# Patient Record
Sex: Male | Born: 1995 | Race: Black or African American | Hispanic: No | Marital: Single | Smoking: Never smoker
Health system: Southern US, Community
[De-identification: ages and names within clinical notes are randomized; demographics above are authoritative.]

---
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
## 2016-09-04 ENCOUNTER — Encounter (HOSPITAL_COMMUNITY): Payer: Self-pay

## 2016-09-04 ENCOUNTER — Emergency Department (HOSPITAL_COMMUNITY)
Admission: EM | Admit: 2016-09-04 | Discharge: 2016-09-05 | Disposition: A | Discharge status: Home / Self Care | Payer: Medicaid Other | Attending: Emergency Medicine | Admitting: Emergency Medicine

## 2016-09-04 DIAGNOSIS — N342 Other urethritis: Secondary | ICD-10-CM | POA: Insufficient documentation

## 2016-09-04 LAB — URINALYSIS, ROUTINE W REFLEX MICROSCOPIC
Bilirubin Urine: NEGATIVE
Glucose, UA: NEGATIVE mg/dL
Hgb urine dipstick: NEGATIVE
Ketones, ur: NEGATIVE mg/dL
Leukocytes, UA: NEGATIVE
Nitrite: NEGATIVE
Protein, ur: NEGATIVE mg/dL
Specific Gravity, Urine: 1.01 (ref 1.005–1.030)
pH: 6.5 (ref 5.0–8.0)

## 2016-09-04 MED ORDER — AZITHROMYCIN 250 MG PO TABS
1000.0000 mg | ORAL_TABLET | Freq: Once | ORAL | Status: AC
  Administered 2016-09-04: 1000 mg via ORAL
  Filled 2016-09-04: qty 4

## 2016-09-04 MED ORDER — LIDOCAINE HCL (PF) 1 % IJ SOLN
INTRAMUSCULAR | Status: AC
  Administered 2016-09-04
  Filled 2016-09-04: qty 5

## 2016-09-04 MED ORDER — CEFTRIAXONE SODIUM 250 MG IJ SOLR
250.0000 mg | Freq: Once | INTRAMUSCULAR | Status: AC
  Administered 2016-09-04: 250 mg via INTRAMUSCULAR
  Filled 2016-09-04: qty 250

## 2016-09-04 NOTE — ED Provider Notes (Signed)
[facility]-EMERGENCY DEPT Provider Note   CSN: [id number] Arrival date & time: 09/04/16  2119     History   Chief Complaint Chief Complaint  Patient presents with  . Urinary Tract Infection    HPI Joel Elliott is a 20 y.o. male.  The history is provided by the patient. No language interpreter was used.  Urinary Tract Infection   This is a new problem. The problem occurs every urination. The problem has not changed since onset.The pain is moderate. There has been no fever. Pertinent negatives include no chills and no vomiting. He has tried nothing for the symptoms.  Pt complains of burning with urination  History reviewed. No pertinent past medical history.  There are no active problems to display for this patient.   History reviewed. No pertinent surgical history.     Home Medications    Prior to Admission medications   Medication Sig Start Date End Date Taking? Authorizing Provider  albuterol (PROVENTIL HFA;VENTOLIN HFA) 108 (90 BASE) MCG/ACT inhaler Inhale 2 puffs into the lungs every 6 (six) hours as needed. For shortness of breath    Historical Provider, MD    Family History History reviewed. No pertinent family history.  Social History Social History  Substance Use Topics  . Smoking status: Never Smoker  . Smokeless tobacco: Never Used  . Alcohol use No     Allergies   Review of patient's allergies indicates no known allergies.   Review of Systems Review of Systems  Constitutional: Negative for chills.  Gastrointestinal: Negative for vomiting.  All other systems reviewed and are negative.    Physical Exam Updated Vital Signs BP 130/66 (BP Location: Left Arm)   Pulse 83   Temp 98.2 F (36.8 C) (Oral)   Resp 16   Ht 5\' 7"  (1.702 m)   Wt 54.9 kg   SpO2 100%   BMI 18.95 kg/m   Physical Exam  Constitutional: He is oriented to person, place, and time. He appears well-developed and well-nourished.  HENT:  Head: Normocephalic and atraumatic.    Cardiovascular: Normal rate.   Pulmonary/Chest: Effort normal.  Abdominal: Soft.  Genitourinary: Penis normal. No penile tenderness.  Musculoskeletal: Normal range of motion.  Neurological: He is alert and oriented to person, place, and time.  Skin: Skin is warm.  Psychiatric: He has a normal mood and affect.  Nursing note and vitals reviewed.    ED Treatments / Results  Labs (all labs ordered are listed, but only abnormal results are displayed) Labs Reviewed  URINALYSIS, ROUTINE W REFLEX MICROSCOPIC (NOT AT [facility]) - Abnormal; Notable for the following:       Result Value   Color, Urine STRAW (*)    All other components within normal limits  GC/CHLAMYDIA PROBE AMP ([place]) NOT AT [facility]    EKG  EKG Interpretation None       Radiology No results found.  Procedures Procedures (including critical care time)  Medications Ordered in ED Medications  cefTRIAXone (ROCEPHIN) injection 250 mg (not administered)  azithromycin (ZITHROMAX) tablet 1,000 mg (not administered)     Initial Impression / Assessment and Plan / ED Course  I have reviewed the triage vital signs and the nursing notes.  Pertinent labs & imaging results that were available during my care of the patient were reviewed by me and considered in my medical decision making (see chart for details).  Clinical Course    gc and ct pending   Pt given Rocephin and Zithromax Pt counseled  on std's and condom use Final Clinical Impressions(s) / ED Diagnoses   Final diagnoses:  Urethritis    New Prescriptions New Prescriptions   No medications on file  An After Visit Summary was printed and given to the patient.   Lonia Skinner [place], PA-C 09/04/16 2342    Layla Maw [place], DO 09/05/16 6045

## 2016-09-04 NOTE — ED Triage Notes (Signed)
I am having burning with urination that started around the beginning of the week.  Last sexual activity was on Saturday and started burning on Monday.  Did use condoms on Saturday.  Was with my normal sexual partner.

## 2016-09-08 LAB — GC/CHLAMYDIA PROBE AMP (~~LOC~~) NOT AT ARMC
CHLAMYDIA, DNA PROBE: NEGATIVE
NEISSERIA GONORRHEA: NEGATIVE

## 2018-06-08 ENCOUNTER — Emergency Department (HOSPITAL_COMMUNITY)
Admission: EM | Admit: 2018-06-08 | Discharge: 2018-06-08 | Disposition: A | Discharge status: Home / Self Care | Payer: Self-pay | Attending: Emergency Medicine | Admitting: Emergency Medicine

## 2018-06-08 ENCOUNTER — Encounter (HOSPITAL_COMMUNITY): Payer: Self-pay | Admitting: Emergency Medicine

## 2018-06-08 ENCOUNTER — Other Ambulatory Visit: Payer: Self-pay

## 2018-06-08 DIAGNOSIS — N342 Other urethritis: Secondary | ICD-10-CM

## 2018-06-08 DIAGNOSIS — N341 Nonspecific urethritis: Secondary | ICD-10-CM | POA: Insufficient documentation

## 2018-06-08 LAB — URINALYSIS, ROUTINE W REFLEX MICROSCOPIC
BACTERIA UA: NONE SEEN
BILIRUBIN URINE: NEGATIVE
Glucose, UA: NEGATIVE mg/dL
HGB URINE DIPSTICK: NEGATIVE
Ketones, ur: NEGATIVE mg/dL
NITRITE: NEGATIVE
PROTEIN: NEGATIVE mg/dL
Specific Gravity, Urine: 1.017 (ref 1.005–1.030)
pH: 8 (ref 5.0–8.0)

## 2018-06-08 MED ORDER — AZITHROMYCIN 250 MG PO TABS
1000.0000 mg | ORAL_TABLET | Freq: Once | ORAL | Status: AC
  Administered 2018-06-08: 1000 mg via ORAL
  Filled 2018-06-08: qty 4

## 2018-06-08 MED ORDER — CEFTRIAXONE SODIUM 250 MG IJ SOLR
250.0000 mg | Freq: Once | INTRAMUSCULAR | Status: AC
  Administered 2018-06-08: 250 mg via INTRAMUSCULAR
  Filled 2018-06-08: qty 250

## 2018-06-08 MED ORDER — ONDANSETRON 4 MG PO TBDP
ORAL_TABLET | ORAL | Status: AC
  Filled 2018-06-08: qty 1

## 2018-06-08 MED ORDER — LIDOCAINE HCL (PF) 1 % IJ SOLN
INTRAMUSCULAR | Status: AC
  Administered 2018-06-08: 0.9 mL
  Filled 2018-06-08: qty 2

## 2018-06-08 MED ORDER — ONDANSETRON 4 MG PO TBDP
4.0000 mg | ORAL_TABLET | Freq: Once | ORAL | Status: AC
  Administered 2018-06-08: 4 mg via ORAL

## 2018-06-08 NOTE — ED Triage Notes (Signed)
Pt reports burning to urethra and yellow/clear penile discharge. Recent unprotected intercourse.

## 2018-06-08 NOTE — Discharge Instructions (Addendum)
You have been treated for gonorrhea and chlamydia today with the 2 medicines given you as these are the 2 most likely infections that cause discharge and pain.  Your cultures should take about 2 days to result and you will be notified if positive.  Your girlfriend will need to be treated, too.  Do not have sex for the next week or until you have been treated and your symptoms are gone.  Your HIV and your syphilis blood tests are also pending and you will be notified if either is positive as well.

## 2018-06-08 NOTE — ED Provider Notes (Signed)
[facility] EMERGENCY DEPARTMENT Provider Note   CSN: [id number] Arrival date & time: 06/08/18  1723     History   Chief Complaint Chief Complaint  Patient presents with  . SEXUALLY TRANSMITTED DISEASE    HPI Joel Elliott is a 22 y.o. male reporting unprotected sex with his girlfriend of the past few months with burning pain with urination, increased urinary frequency and sensation of incomplete emptying of his bladder in association with clear to yellow penile discharge.  He denies fevers, chills, sore throat, abdominal, pelvic or back pain and also denies rash or penile lesions.  His girlfriend is asymptomatic.  Pt has had no treatment prior to arrival.  The history is provided by the patient.    History reviewed. No pertinent past medical history.  There are no active problems to display for this patient.   History reviewed. No pertinent surgical history.      Home Medications    Prior to Admission medications   Medication Sig Start Date End Date Taking? Authorizing Provider  albuterol (PROVENTIL HFA;VENTOLIN HFA) 108 (90 BASE) MCG/ACT inhaler Inhale 2 puffs into the lungs every 6 (six) hours as needed. For shortness of breath    [provider]    Family History No family history on file.  Social History Social History   Tobacco Use  . Smoking status: Never Smoker  . Smokeless tobacco: Never Used  Substance Use Topics  . Alcohol use: No  . Drug use: Yes    Types: Marijuana    Comment: last use this morning     Allergies   Patient has no known allergies.   Review of Systems Review of Systems  Constitutional: Negative for chills and fever.  HENT: Negative for congestion and sore throat.   Eyes: Negative.   Respiratory: Negative for chest tightness and shortness of breath.   Cardiovascular: Negative for chest pain.  Gastrointestinal: Negative for abdominal pain, nausea and vomiting.  Genitourinary: Positive for discharge, dysuria and  frequency. Negative for scrotal swelling, testicular pain and urgency.  Musculoskeletal: Negative for arthralgias, joint swelling and neck pain.  Skin: Negative.  Negative for rash and wound.  Neurological: Negative for dizziness, weakness, light-headedness, numbness and headaches.  Psychiatric/Behavioral: Negative.      Physical Exam Updated Vital Signs BP 136/80   Pulse 79   Temp 99.3 F (37.4 C) (Oral)   Resp 18   Ht 5\' 7"  (1.702 m)   Wt 54.4 kg (120 lb)   SpO2 100%   BMI 18.79 kg/m   Physical Exam  Constitutional: He appears well-developed and well-nourished.  HENT:  Head: Normocephalic and atraumatic.  Eyes: Conjunctivae are normal.  Neck: Normal range of motion.  Cardiovascular: Normal rate, regular rhythm and normal heart sounds.  Pulmonary/Chest: Effort normal and breath sounds normal. He has no wheezes.  Abdominal: Soft. Bowel sounds are normal. He exhibits no distension. There is no tenderness. There is no guarding.  Genitourinary: Penis normal.  Genitourinary Comments: Chaperone was present during exam.   Musculoskeletal: Normal range of motion.  Neurological: He is alert.  Skin: Skin is warm and dry.  Psychiatric: He has a normal mood and affect.  Nursing note and vitals reviewed.    ED Treatments / Results  Labs (all labs ordered are listed, but only abnormal results are displayed) Labs Reviewed  URINALYSIS, ROUTINE W REFLEX MICROSCOPIC - Abnormal; Notable for the following components:      Result Value   Leukocytes, UA SMALL (*)  WBC, UA >50 (*)    All other components within normal limits  HIV ANTIBODY (ROUTINE TESTING)  RPR  GC/CHLAMYDIA PROBE AMP ([place]) NOT AT [facility]    EKG None  Radiology No results found.  Procedures Procedures (including critical care time)  Medications Ordered in ED Medications  cefTRIAXone (ROCEPHIN) injection 250 mg (has no administration in time range)  azithromycin (ZITHROMAX) tablet 1,000 mg (has no  administration in time range)     Initial Impression / Assessment and Plan / ED Course  I have reviewed the triage vital signs and the nursing notes.  Pertinent labs & imaging results that were available during my care of the patient were reviewed by me and considered in my medical decision making (see chart for details).     Urinalysis reviewed. Gc/chlamydia cx pending, pt also requested HIV, syphilis screening.  Given rocephin, zithromax. Pt aware cultures pending.  Discussed safe sex, condoms.  Advised girlfriend will need to seek tx also if cx positive. No abd or back pain, no fevers. Exam and hx suggesting simple urethritis.  Final Clinical Impressions(s) / ED Diagnoses   Final diagnoses:  Urethritis    ED Discharge Orders    None       Victoriano Laindol, Kaleo Condrey, PA-C 06/08/18 1826    Maia PlanLong, Joshua G, MD 06/08/18 1919

## 2018-06-09 LAB — RPR: RPR Ser Ql: NONREACTIVE

## 2018-06-09 LAB — HIV ANTIBODY (ROUTINE TESTING W REFLEX): HIV Screen 4th Generation wRfx: NONREACTIVE

## 2018-06-09 LAB — GC/CHLAMYDIA PROBE AMP (~~LOC~~) NOT AT ARMC
Chlamydia: POSITIVE — AB
NEISSERIA GONORRHEA: POSITIVE — AB

## 2019-11-09 ENCOUNTER — Other Ambulatory Visit: Payer: Self-pay

## 2019-11-09 ENCOUNTER — Emergency Department
Admission: EM | Admit: 2019-11-09 | Discharge: 2019-11-09 | Disposition: A | Discharge status: Home / Self Care | Payer: Medicaid Other | Attending: Student in an Organized Health Care Education/Training Program | Admitting: Student in an Organized Health Care Education/Training Program

## 2019-11-09 DIAGNOSIS — M7918 Myalgia, other site: Secondary | ICD-10-CM

## 2019-11-09 DIAGNOSIS — M545 Low back pain: Secondary | ICD-10-CM | POA: Insufficient documentation
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
## 2021-04-21 ENCOUNTER — Other Ambulatory Visit: Payer: Self-pay

## 2021-04-21 ENCOUNTER — Emergency Department
Admission: EM | Admit: 2021-04-21 | Discharge: 2021-04-21 | Disposition: A | Discharge status: Home / Self Care | Payer: 59 | Attending: Emergency Medicine | Admitting: Emergency Medicine

## 2021-04-21 ENCOUNTER — Emergency Department: Payer: 59

## 2021-04-21 DIAGNOSIS — X58XXXA Exposure to other specified factors, initial encounter: Secondary | ICD-10-CM | POA: Diagnosis not present

## 2021-04-21 DIAGNOSIS — J069 Acute upper respiratory infection, unspecified: Secondary | ICD-10-CM | POA: Diagnosis not present

## 2021-04-21 DIAGNOSIS — S3992XA Unspecified injury of lower back, initial encounter: Secondary | ICD-10-CM | POA: Diagnosis present

## 2021-04-21 DIAGNOSIS — S39012A Strain of muscle, fascia and tendon of lower back, initial encounter: Secondary | ICD-10-CM | POA: Diagnosis not present

## 2021-04-21 DIAGNOSIS — J40 Bronchitis, not specified as acute or chronic: Secondary | ICD-10-CM | POA: Diagnosis not present

## 2021-04-21 LAB — URINALYSIS, COMPLETE (UACMP) WITH MICROSCOPIC
Bacteria, UA: NONE SEEN
Bilirubin Urine: NEGATIVE
Glucose, UA: NEGATIVE mg/dL
Ketones, ur: NEGATIVE mg/dL
Leukocytes,Ua: NEGATIVE
Nitrite: NEGATIVE
Protein, ur: NEGATIVE mg/dL
Specific Gravity, Urine: 1.006 (ref 1.005–1.030)
Squamous Epithelial / LPF: NONE SEEN (ref 0–5)
pH: 9 — ABNORMAL HIGH (ref 5.0–8.0)

## 2021-04-21 MED ORDER — AZITHROMYCIN 250 MG PO TABS: 250.0000 mg | ORAL_TABLET | Freq: Every day | ORAL | 0 refills | Status: AC

## 2021-04-21 MED ORDER — AZITHROMYCIN 500 MG PO TABS
500.0000 mg | ORAL_TABLET | Freq: Once | ORAL | Status: AC
  Administered 2021-04-21: 500 mg via ORAL
  Filled 2021-04-21: qty 1

## 2021-04-21 NOTE — ED Notes (Signed)
Provided DC instructions. Verbalized understanding.  

## 2021-04-21 NOTE — ED Provider Notes (Signed)
[facility] Emergency Department Provider Note ____________________________________________  Time seen: 1509  I have reviewed the triage vital signs and the nursing notes.  HISTORY  Chief Complaint  Back Pain and Nasal Congestion  HPI Joel Elliott is a 25 y.o. male presents to the ED for evaluation of productive cough.  He reports onset today, but denies any associated fever, nausea, vomiting.  He does report some subjective fevers and chills.  He denies any nausea, vomiting, diarrhea, or constipation.  He also reports some bilateral lumbosacral back pain, that he believes is related to his work activities.  He denies any bladder or bowel incontinence, foot drop, or saddle anesthesias.  History reviewed. No pertinent past medical history.  There are no problems to display for this patient.   History reviewed. No pertinent surgical history.  Prior to Admission medications   Medication Sig Start Date End Date Taking? Authorizing Provider  azithromycin (ZITHROMAX Z-PAK) 250 MG tablet Take 1 tablet (250 mg total) by mouth daily for 4 days. 04/22/21 04/26/21 Yes Lorece Keach, Charlesetta Ivory, PA-C  albuterol (PROVENTIL HFA;VENTOLIN HFA) 108 (90 BASE) MCG/ACT inhaler Inhale 2 puffs into the lungs every 6 (six) hours as needed. For shortness of breath    [provider]    Allergies Patient has no known allergies.  History reviewed. No pertinent family history.  Social History Social History   Tobacco Use  . Smoking status: Never Smoker  . Smokeless tobacco: Never Used  Vaping Use  . Vaping Use: Never used  Substance Use Topics  . Alcohol use: No  . Drug use: Yes    Types: Marijuana    Comment: last use this morning    Review of Systems  Constitutional: Negative for fever. Eyes: Negative for visual changes. ENT: Negative for sore throat. Cardiovascular: Negative for chest pain. Respiratory: Negative for shortness of breath.  Reports productive  cough. Gastrointestinal: Negative for abdominal pain, vomiting and diarrhea. Genitourinary: Negative for dysuria. Musculoskeletal: Positive for lower back pain. Skin: Negative for rash. Neurological: Negative for headaches, focal weakness or numbness. ____________________________________________  PHYSICAL EXAM:  VITAL SIGNS: ED Triage Vitals  Enc Vitals Group     BP 04/21/21 1503 (!) 141/74     Pulse Rate 04/21/21 1503 68     Resp 04/21/21 1503 18     Temp 04/21/21 1507 99.8 F (37.7 C)     Temp Source 04/21/21 1507 Oral     SpO2 04/21/21 1503 100 %     Weight 04/21/21 1504 130 lb (59 kg)     Height 04/21/21 1504 5\' 7"  (1.702 m)     Head Circumference --      Peak Flow --      Pain Score 04/21/21 1503 6     Pain Loc --      Pain Edu? --      Excl. in GC? --     Constitutional: Alert and oriented. Well appearing and in no distress. Head: Normocephalic and atraumatic. Eyes: Conjunctivae are normal. PERRL. Normal extraocular movements Ears: Canals clear. TMs intact bilaterally. Nose: No congestion/rhinorrhea/epistaxis. Mouth/Throat: Mucous membranes are moist. Neck: Supple. No thyromegaly. Hematological/Lymphatic/Immunological: No cervical lymphadenopathy. Cardiovascular: Normal rate, regular rhythm. Normal distal pulses. Respiratory: Normal respiratory effort. No wheezes/rales/rhonchi. Gastrointestinal: Soft and nontender. No distention. Musculoskeletal: Spinal alignment without midline tenderness, spasm, vomiting, or step-off.  Patient with reproducible bilateral musculoskeletal pain along the paraspinal musculature.  Nontender with normal range of motion in all extremities.  Neurologic: Cranial nerves  II to XII grossly intact.  Normal gait without ataxia. Normal speech and language. No gross focal neurologic deficits are appreciated. Skin:  Skin is warm, dry and intact. No rash noted. ____________________________________________   RADIOLOGY  CXR IMPRESSION: No active  cardiopulmonary disease.  ____________________________________________  PROCEDURES  Azithromycin 500 mg p.o.  Procedures ____________________________________________   INITIAL IMPRESSION / ASSESSMENT AND PLAN / ED COURSE  As part of my medical decision making, I reviewed the following data within the electronic MEDICAL RECORD NUMBER Radiograph reviewed WNL and Notes from prior ED visits     Patient ED evaluation of 1 day complaint of productive cough as well as some bilateral lower back musculoskeletal pain.  Exam is overall benign return at this time for no signs of acute respiratory distress, or toxic appearance.  Patient with reproducible musculoskeletal pain on exam consistent with muscle strain.  X-ray did not reveal any acute intrathoracic process.  Patient will be treated empirically with azithromycin for presumed bronchitis versus subclinical pneumonia.  He will follow-up with primary provider return to the ED if needed.  [name] was evaluated in Emergency Department on 04/21/2021 for the symptoms described in the history of present illness. He was evaluated in the context of the global COVID-19 pandemic, which necessitated consideration that the patient might be at risk for infection with the SARS-CoV-2 virus that causes COVID-19. Institutional protocols and algorithms that pertain to the evaluation of patients at risk for COVID-19 are in a state of rapid change based on information released by regulatory bodies including the CDC and federal and state organizations. These policies and algorithms were followed during the patient's care in the ED. ____________________________________________  FINAL CLINICAL IMPRESSION(S) / ED DIAGNOSES  Final diagnoses:  Viral URI with cough  Bronchitis  Lumbar strain, initial encounter      Lissa Hoard, PA-C 04/21/21 1926    Merwyn Katos, MD 04/21/21 2322

## 2021-04-21 NOTE — Discharge Instructions (Signed)
Your exam, labs, chest x-ray are all normal and reassuring at this time.  No indication of any acute infectious process.  You were treated for bronchitis at this time with a prescription for azithromycin antibiotic.  Take as directed.  Continue to hydrate to help clear secretions.  Consider over-the-counter cough medicine as needed.  You may also take ibuprofen as needed for musculoskeletal back pain.  Follow-up with your provider or [place] urgent care for ongoing symptoms.

## 2021-04-21 NOTE — ED Triage Notes (Signed)
Pt states he has had back pain and nose stuffiness for the past few days- pt states he has also been coughing up some phelgm

## 2021-08-04 ENCOUNTER — Emergency Department: Payer: Medicaid Other

## 2021-08-04 ENCOUNTER — Emergency Department
Admission: EM | Admit: 2021-08-04 | Discharge: 2021-08-04 | Disposition: A | Discharge status: Home / Self Care | Payer: Medicaid Other | Attending: Emergency Medicine | Admitting: Emergency Medicine

## 2021-08-04 ENCOUNTER — Encounter: Payer: Self-pay | Admitting: Emergency Medicine

## 2021-08-04 ENCOUNTER — Other Ambulatory Visit: Payer: Self-pay

## 2021-08-04 DIAGNOSIS — Y9351 Activity, roller skating (inline) and skateboarding: Secondary | ICD-10-CM | POA: Insufficient documentation

## 2021-08-04 DIAGNOSIS — Y9289 Other specified places as the place of occurrence of the external cause: Secondary | ICD-10-CM | POA: Insufficient documentation

## 2021-08-04 DIAGNOSIS — S63501A Unspecified sprain of right wrist, initial encounter: Secondary | ICD-10-CM

## 2021-08-04 MED ORDER — MELOXICAM 15 MG PO TABS: 15.0000 mg | ORAL_TABLET | Freq: Every day | ORAL | 0 refills | Status: AC

## 2021-08-04 NOTE — ED Triage Notes (Signed)
Pt fell on R wrist while skating today, c/o pain and decreased ROM.

## 2021-08-04 NOTE — ED Provider Notes (Signed)
[facility] Emergency Department Provider Note  ____________________________________________  Time seen: Approximately 8:51 PM  I have reviewed the triage vital signs and the nursing notes.   HISTORY  Chief Complaint Wrist Injury    HPI Joel Elliott is a 25 y.o. male who presents the emergency department complaining of right wrist pain.  Patient states that he was skateboarding had multiple falls onto his wrist.  He is unsure which phone because the pain when he got home he realized that his wrist was hurting along the dorsal aspect.  He still able to move his wrist appropriately.  No other injury or complaint.  No medications prior to arrival.       History reviewed. No pertinent past medical history.  There are no problems to display for this patient.   History reviewed. No pertinent surgical history.  Prior to Admission medications   Medication Sig Start Date End Date Taking? Authorizing Provider  albuterol (PROVENTIL HFA;VENTOLIN HFA) 108 (90 BASE) MCG/ACT inhaler Inhale 2 puffs into the lungs every 6 (six) hours as needed. For shortness of breath    [provider]    Allergies Patient has no known allergies.  History reviewed. No pertinent family history.  Social History Social History   Tobacco Use   Smoking status: Never   Smokeless tobacco: Never  Vaping Use   Vaping Use: Never used  Substance Use Topics   Alcohol use: No   Drug use: Yes    Types: Marijuana    Comment: last use this morning     Review of Systems  Constitutional: No fever/chills Eyes: No visual changes. No discharge ENT: No upper respiratory complaints. Cardiovascular: no chest pain. Respiratory: no cough. No SOB. Gastrointestinal: No abdominal pain.  No nausea, no vomiting.  No diarrhea.  No constipation. Musculoskeletal: Right wrist pain Skin: Negative for rash, abrasions, lacerations, ecchymosis. Neurological: Negative for headaches, focal  weakness or numbness.  10 System ROS otherwise negative.  ____________________________________________   PHYSICAL EXAM:  VITAL SIGNS: ED Triage Vitals  Enc Vitals Group     BP 08/04/21 1919 128/70     Pulse Rate 08/04/21 1919 99     Resp 08/04/21 1919 18     Temp 08/04/21 1919 98 F (36.7 C)     Temp Source 08/04/21 1919 Oral     SpO2 08/04/21 1919 99 %     Weight 08/04/21 1920 130 lb 1.1 oz (59 kg)     Height 08/04/21 1920 5\' 7"  (1.702 m)     Head Circumference --      Peak Flow --      Pain Score 08/04/21 1920 7     Pain Loc --      Pain Edu? --      Excl. in GC? --      Constitutional: Alert and oriented. Well appearing and in no acute distress. Eyes: Conjunctivae are normal. PERRL. EOMI. Head: Atraumatic. ENT:      Ears:       Nose: No congestion/rhinnorhea.      Mouth/Throat: Mucous membranes are moist.  Neck: No stridor.    Cardiovascular: Normal rate, regular rhythm. Normal S1 and S2.  Good peripheral circulation. Respiratory: Normal respiratory effort without tachypnea or retractions. Lungs CTAB. Good air entry to the bases with no decreased or absent breath sounds. Musculoskeletal: Full range of motion to all extremities. No gross deformities appreciated.  Visualization of the right wrist reveals no deformity.  Good range of motion.  Palpation along the dorsal joint line but no palpable abnormality. Neurologic:  Normal speech and language. No gross focal neurologic deficits are appreciated.  Skin:  Skin is warm, dry and intact. No rash noted. Psychiatric: Mood and affect are normal. Speech and behavior are normal. Patient exhibits appropriate insight and judgement.   ____________________________________________   LABS (all labs ordered are listed, but only abnormal results are displayed)  Labs Reviewed - No data to display ____________________________________________  EKG   ____________________________________________  RADIOLOGY I personally viewed  and evaluated these images as part of my medical decision making, as well as reviewing the written report by the radiologist.  ED Provider Interpretation: No acute traumatic findings  DG Wrist Complete Right  Result Date: 08/04/2021 CLINICAL DATA:  Fall today, pain and decreased range of motion. EXAM: RIGHT WRIST - COMPLETE 3+ VIEW COMPARISON:  None. FINDINGS: Osseous alignment is normal. No fracture line or displaced fracture fragment is seen. Soft tissues about the RIGHT wrist are unremarkable. IMPRESSION: Negative. Electronically Signed   By: Bary Richard M.D.   On: 08/04/2021 19:39    ____________________________________________    PROCEDURES  Procedure(s) performed:    Procedures    Medications - No data to display   ____________________________________________   INITIAL IMPRESSION / ASSESSMENT AND PLAN / ED COURSE  Pertinent labs & imaging results that were available during my care of the patient were reviewed by me and considered in my medical decision making (see chart for details).  Review of the [place] CSRS was performed in accordance of the [facility] prior to dispensing any controlled drugs.           Patient's diagnosis is consistent with wrist pain.  Patient presented to the emergency department complaining of pain to the wrist after falling while skateboarding.  No acute findings on x-ray.  Exam was overall reassuring.  Velcro wrist brace and anti-inflammatory for symptom relief.  Follow-up with orthopedics as needed.  Patient is given ED precautions to return to the ED for any worsening or new symptoms.     ____________________________________________  FINAL CLINICAL IMPRESSION(S) / ED DIAGNOSES  Final diagnoses:  Sprain of right wrist, initial encounter      NEW MEDICATIONS STARTED DURING THIS VISIT:  ED Discharge Orders     None           This chart was dictated using voice recognition software/Dragon. Despite best efforts to proofread,  errors can occur which can change the meaning. Any change was purely unintentional.    Racheal Patches, PA-C 08/04/21 2105    Chesley Noon, MD 08/05/21 [phone]

## 2021-08-04 NOTE — ED Triage Notes (Signed)
FIRST NURSE NOTE:   Pt c/o R wrist injury while skating.

## 2021-12-05 ENCOUNTER — Other Ambulatory Visit: Payer: Self-pay

## 2021-12-05 ENCOUNTER — Telehealth: Payer: Medicaid Other | Admitting: Nurse Practitioner

## 2021-12-05 ENCOUNTER — Encounter: Payer: Self-pay | Admitting: Emergency Medicine

## 2021-12-05 ENCOUNTER — Telehealth: Payer: Self-pay

## 2021-12-05 ENCOUNTER — Emergency Department
Admission: EM | Admit: 2021-12-05 | Discharge: 2021-12-05 | Disposition: A | Discharge status: Home / Self Care | Payer: Medicaid Other | Attending: Emergency Medicine | Admitting: Emergency Medicine

## 2021-12-05 DIAGNOSIS — J101 Influenza due to other identified influenza virus with other respiratory manifestations: Secondary | ICD-10-CM | POA: Insufficient documentation

## 2021-12-05 DIAGNOSIS — Z20822 Contact with and (suspected) exposure to covid-19: Secondary | ICD-10-CM | POA: Insufficient documentation

## 2021-12-05 DIAGNOSIS — J069 Acute upper respiratory infection, unspecified: Secondary | ICD-10-CM | POA: Insufficient documentation

## 2021-12-05 DIAGNOSIS — R6889 Other general symptoms and signs: Secondary | ICD-10-CM

## 2021-12-05 LAB — RESP PANEL BY RT-PCR (FLU A&B, COVID) ARPGX2
Influenza A by PCR: POSITIVE — AB
Influenza B by PCR: NEGATIVE
SARS Coronavirus 2 by RT PCR: NEGATIVE

## 2021-12-05 MED ORDER — OSELTAMIVIR PHOSPHATE 75 MG PO CAPS: 75.0000 mg | ORAL_CAPSULE | Freq: Two times a day (BID) | ORAL | 0 refills | Status: AC

## 2021-12-05 NOTE — Progress Notes (Signed)

## 2021-12-05 NOTE — ED Provider Notes (Signed)
[facility] Emergency Department Provider Note  ____________________________________________   Event Date/Time   First MD Initiated Contact with Patient 12/05/21 1329     (approximate)  I have reviewed the triage vital signs and the nursing notes.   HISTORY  Chief Complaint Nasal Congestion and Cough    HPI Joel Elliott is a 25 y.o. male presents to the emergency department with URI symptoms for 2 days.   Is complaining of cough, congestion, fever, chills, denies chest pain, shortness of breath unsure close contact with Covid19+ patient, patient is not vaccinated.   History reviewed. No pertinent past medical history.  There are no problems to display for this patient.   History reviewed. No pertinent surgical history.  Prior to Admission medications   Medication Sig Start Date End Date Taking? Authorizing Provider  albuterol (PROVENTIL HFA;VENTOLIN HFA) 108 (90 BASE) MCG/ACT inhaler Inhale 2 puffs into the lungs every 6 (six) hours as needed. For shortness of breath    [provider]  meloxicam (MOBIC) 15 MG tablet Take 1 tablet (15 mg total) by mouth daily. 08/04/21   Cuthriell, Delorise Royals, PA-C    Allergies Patient has no known allergies.  History reviewed. No pertinent family history.  Social History Social History   Tobacco Use   Smoking status: Never   Smokeless tobacco: Never  Vaping Use   Vaping Use: Never used  Substance Use Topics   Alcohol use: No   Drug use: Yes    Types: Marijuana    Comment: last use this morning    Review of Systems  Constitutional: Positive fever/chills Eyes: No visual changes. ENT: Denies sore throat. Respiratory: Positive cough Cardiovascular: Denies chest pain Gastrointestinal: Denies abdominal pain Genitourinary: Negative for dysuria. Musculoskeletal: Negative for back pain. Skin: Negative for rash. Neurological: Denies neurological  changes    ____________________________________________   PHYSICAL EXAM:  VITAL SIGNS: ED Triage Vitals  Enc Vitals Group     BP 12/05/21 1312 130/80     Pulse Rate 12/05/21 1312 87     Resp 12/05/21 1312 20     Temp 12/05/21 1312 98.7 F (37.1 C)     Temp Source 12/05/21 1312 Oral     SpO2 12/05/21 1312 99 %     Weight 12/05/21 1306 130 lb 1.1 oz (59 kg)     Height 12/05/21 1306 5\' 7"  (1.702 m)     Head Circumference --      Peak Flow --      Pain Score 12/05/21 1306 8     Pain Loc --      Pain Edu? --      Excl. in GC? --     Constitutional: Alert and oriented. Well appearing and in no acute distress. Eyes: Conjunctivae are normal.  Head: Atraumatic. Nose: No congestion/rhinnorhea. Mouth/Throat: Mucous membranes are moist.   Neck:  supple no lymphadenopathy noted Cardiovascular: Normal rate, regular rhythm. Heart sounds are normal Respiratory: Normal respiratory effort.  No retractions, lungs CTA GU: deferred Musculoskeletal: FROM all extremities, warm and well perfused Neurologic:  Normal speech and language.  Skin:  Skin is warm, dry and intact. No rash noted. Psychiatric: Mood and affect are normal. Speech and behavior are normal.  ____________________________________________   LABS (all labs ordered are listed, but only abnormal results are displayed)  Labs Reviewed  RESP PANEL BY RT-PCR (FLU A&B, COVID) [id number]   ____________________________________________   ____________________________________________  RADIOLOGY    ____________________________________________   PROCEDURES  Procedure(s)  performed: No  Procedures    ____________________________________________   INITIAL IMPRESSION / ASSESSMENT AND PLAN / ED COURSE  Pertinent labs & imaging results that were available during my care of the patient were reviewed by me and considered in my medical decision making (see chart for details).   Patient is a 25 year old male who complains of  URI symptoms.  Exam is consistent with covid.    Pending test for covid/flu  I did explain everything to the patient.  He is to quarantine himself at home until his test results.  He can see his test results on [facility] MyChart.  He is to use over-the-counter Mucinex, Delsym for cough.  Tylenol/ibuprofen if needed for fever.  Return emergency department if chest pain or shortness of breath.  Discharged in stable condition and in agreement treatment plan.   The patient was instructed to quarantine themselves at home.  Follow-up with your regular doctor if any concerns.  Return emergency department for worsening. OTC measures discussed     Joel Elliott was evaluated in Emergency Department on 12/05/2021 for the symptoms described in the history of present illness. He was evaluated in the context of the global COVID-19 pandemic, which necessitated consideration that the patient might be at risk for infection with the SARS-CoV-2 virus that causes COVID-19. Institutional protocols and algorithms that pertain to the evaluation of patients at risk for COVID-19 are in a state of rapid change based on information released by regulatory bodies including the CDC and federal and state organizations. These policies and algorithms were followed during the patient's care in the ED.   As part of my medical decision making, I reviewed the following data within the electronic MEDICAL RECORD NUMBER Nursing notes reviewed and incorporated, Labs reviewed , Old chart reviewed, Notes from prior ED visits, and [place] Controlled Substance Database  ____________________________________________   FINAL CLINICAL IMPRESSION(S) / ED DIAGNOSES  Final diagnoses:  Acute URI      NEW MEDICATIONS STARTED DURING THIS VISIT:  New Prescriptions   No medications on file     Note:  This document was prepared using Dragon voice recognition software and may include unintentional dictation errors.    Faythe Ghee,  PA-C 12/05/21 1350    Jene Every, MD 12/05/21 [phone]

## 2021-12-05 NOTE — Telephone Encounter (Signed)
Pt calling in wanting results from ER visit today. Pt was given results. He is positive for Flu A. Pt is asking about medications to take, advised that Tamiflu is normally prescribed, asked if he had PCP to f/up with and he states no. Advised him he can do virtual UC visit or Evisit to let them know he tested positive and see if medication can be called in for him. Pt went thru Evisit and is waiting to reply. No other questions/concerns noted.

## 2021-12-05 NOTE — Discharge Instructions (Signed)
Follow-up with your regular doctor if not improving to 3 days.  Return emergency department worsening.  Use over-the-counter Mucinex for congestion, Delsym for cough.  Tylenol or ibuprofen for fever if needed You may see your test results on  MyChart.

## 2021-12-05 NOTE — Progress Notes (Signed)
I have spent 5 minutes in review of e-visit questionnaire, review and updating patient chart, medical decision making and response to patient.  ° °Joel Elliott W Goble Fudala, NP ° °  °

## 2021-12-05 NOTE — ED Triage Notes (Signed)
Pt comes into the ED via POV c/o nasal congestion and cough at night.  Pt has been around his girlfriends kids who have been sick.  Pt has even and unlabored respirations at this time but is in NAD.

## 2022-04-04 IMAGING — CR DG WRIST COMPLETE 3+V*R*
4 series · 4 of 4 positions shown · non-contrast
Comparison: None.

CLINICAL DATA: Fall today, pain and decreased range of motion.

EXAM:
RIGHT WRIST - COMPLETE 3+ VIEW

[wrist pa]
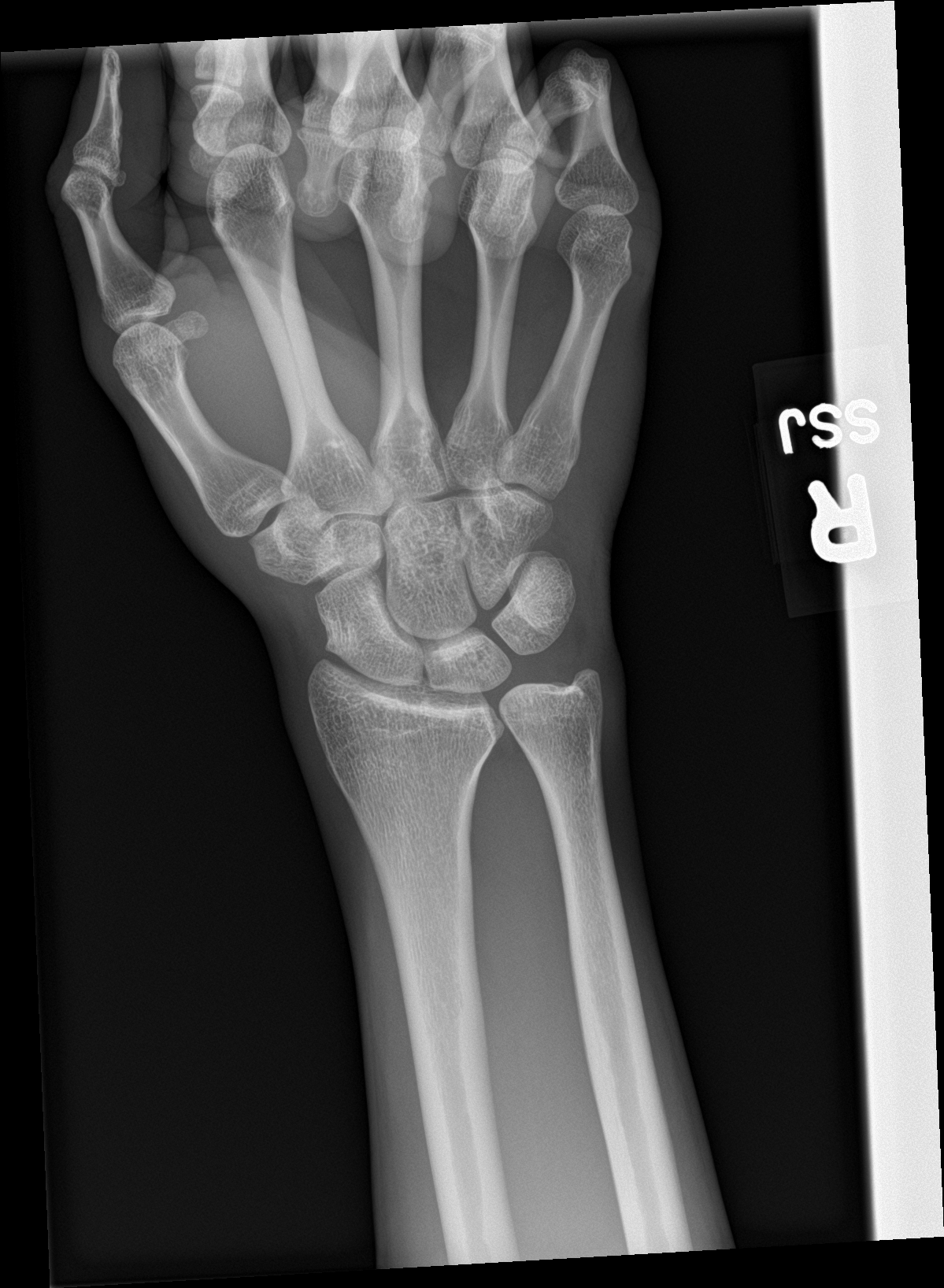

[wrist obl]
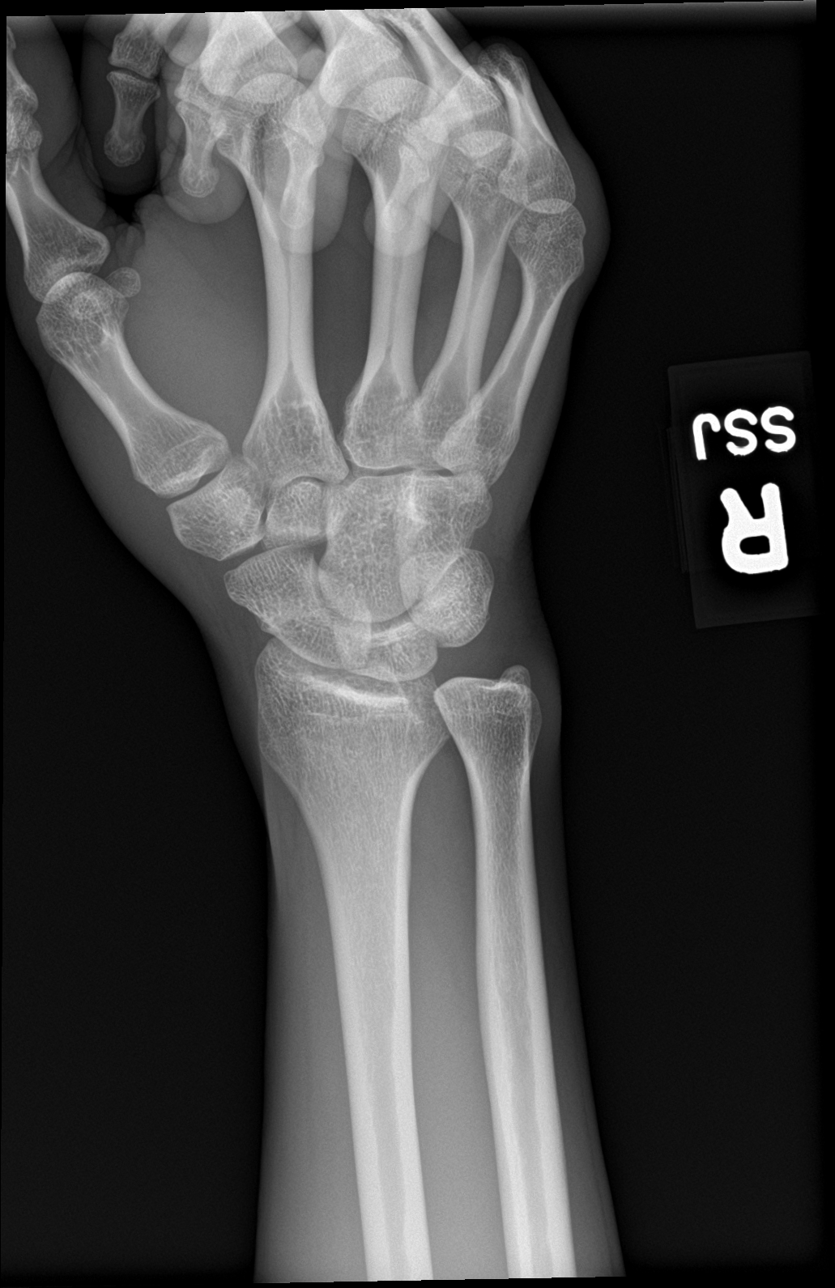

[wrist lat]
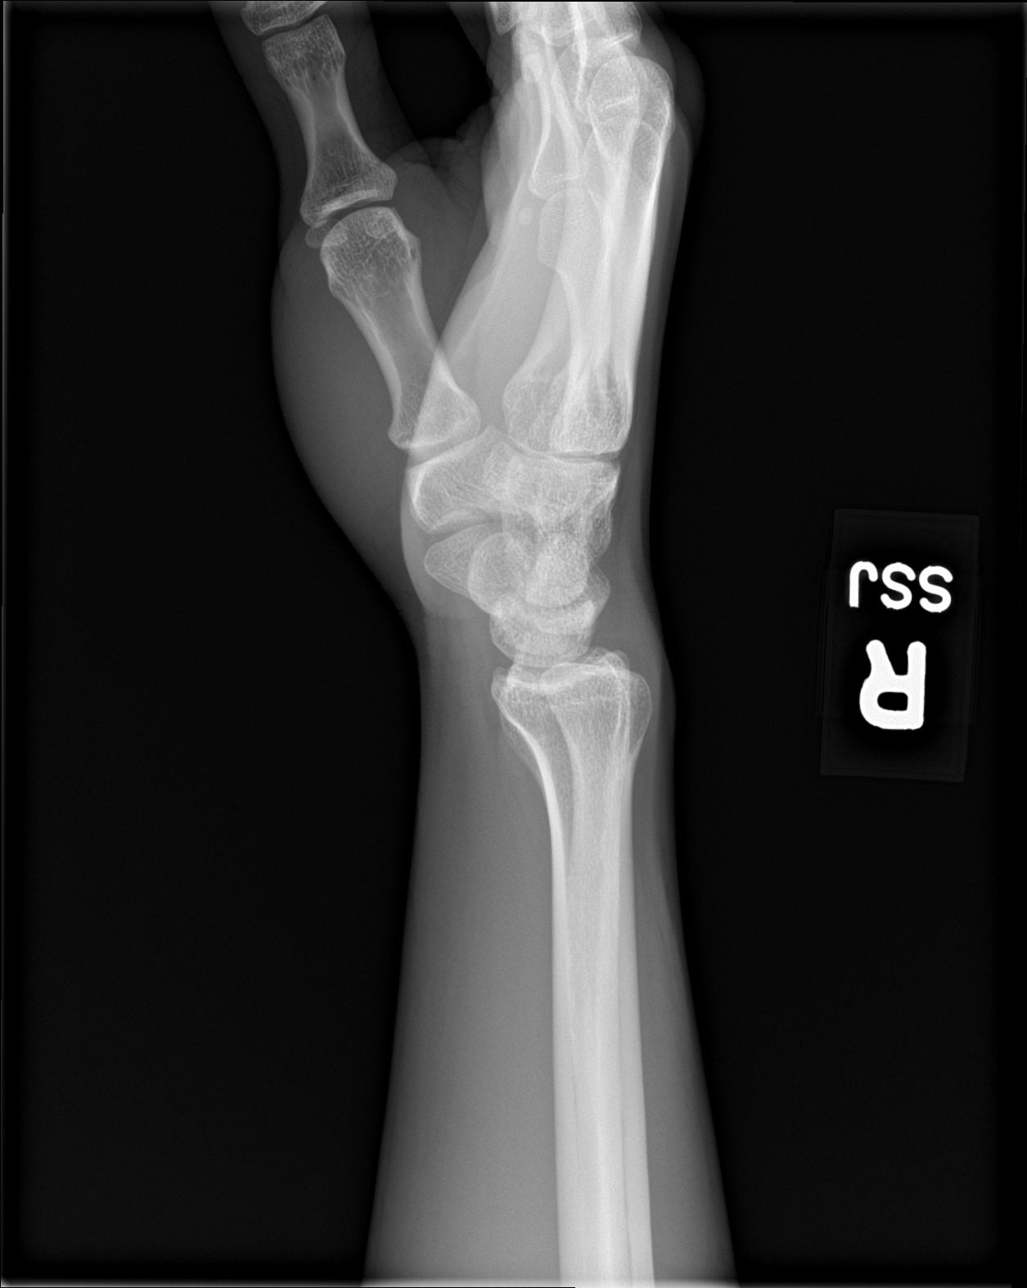

[navicular]
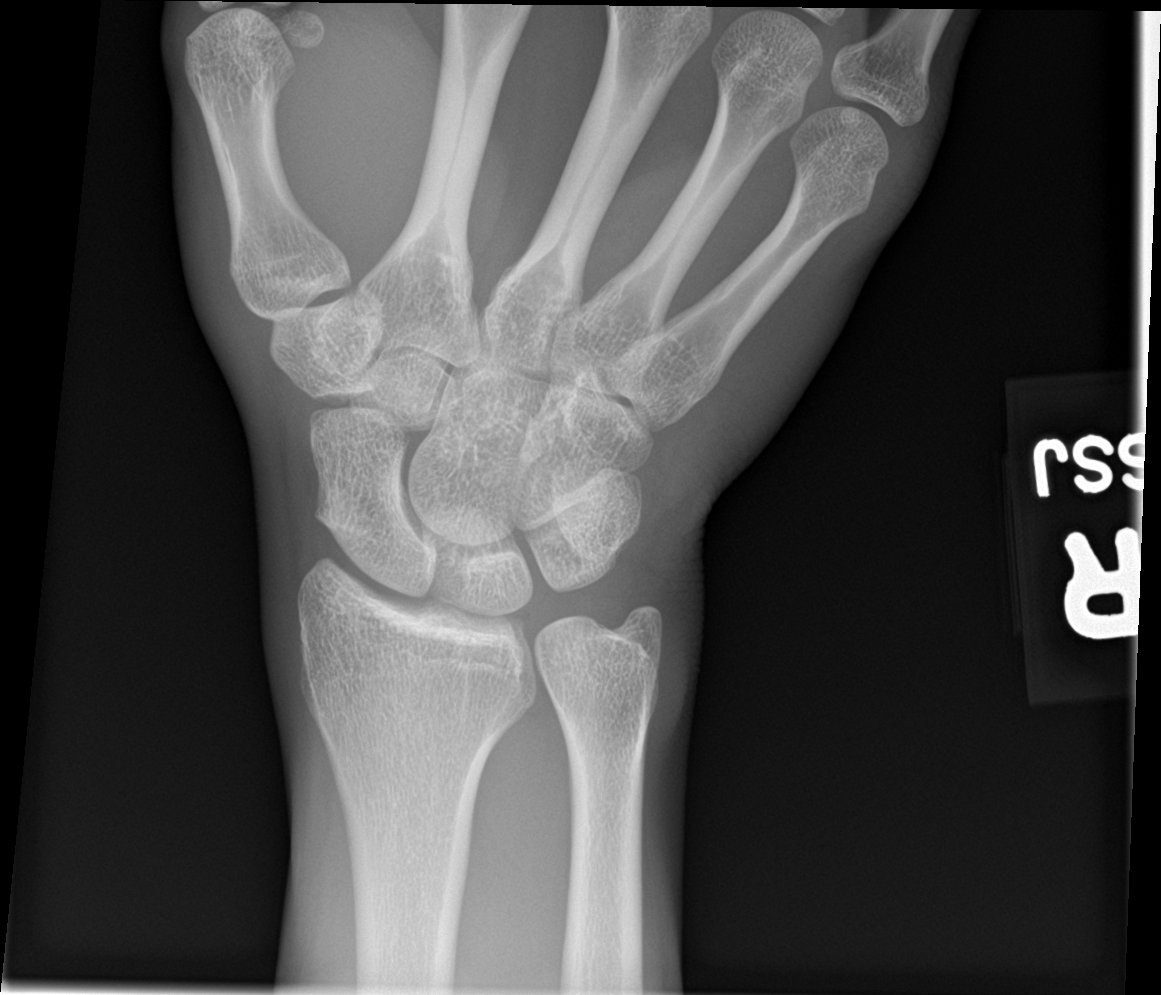

[4 of 4 positions shown; findings below may reference images not displayed]

FINDINGS: Osseous alignment is normal. No fracture line or displaced fracture
fragment is seen. Soft tissues about the RIGHT wrist are
unremarkable.
IMPRESSION: Negative.

## 2022-05-20 ENCOUNTER — Other Ambulatory Visit: Payer: Self-pay

## 2022-05-20 ENCOUNTER — Emergency Department (HOSPITAL_COMMUNITY)
Admission: EM | Admit: 2022-05-20 | Discharge: 2022-05-20 | Disposition: A | Discharge status: Home / Self Care | Payer: Self-pay | Attending: Emergency Medicine | Admitting: Emergency Medicine

## 2022-05-20 ENCOUNTER — Encounter (HOSPITAL_COMMUNITY): Payer: Self-pay

## 2022-05-20 DIAGNOSIS — L0231 Cutaneous abscess of buttock: Secondary | ICD-10-CM | POA: Insufficient documentation

## 2022-05-20 MED ORDER — DOXYCYCLINE HYCLATE 100 MG PO TABS
100.0000 mg | ORAL_TABLET | Freq: Once | ORAL | Status: AC
  Administered 2022-05-20: 100 mg via ORAL
  Filled 2022-05-20: qty 1

## 2022-05-20 MED ORDER — DOXYCYCLINE HYCLATE 100 MG PO CAPS: 100.0000 mg | ORAL_CAPSULE | Freq: Two times a day (BID) | ORAL | 0 refills | Status: AC

## 2022-05-20 NOTE — ED Notes (Signed)
ED Provider at bedside. 

## 2022-05-20 NOTE — Discharge Instructions (Addendum)
Get help right away if you: Have severe pain. See red streaks on your skin spreading away from the abscess. See redness that spreads quickly. Have a fever or chills.

## 2022-05-20 NOTE — ED Triage Notes (Signed)
Pt c/o abscess on his right hip that has gotten worse over the last week, red and hot to the touch. States he tried to drain it last night and it hasn't gotten better

## 2022-05-20 NOTE — ED Provider Notes (Signed)
[facility] EMERGENCY DEPARTMENT Provider Note   CSN: [id number] Arrival date & time: 05/20/22  1851     History  Chief Complaint  Patient presents with   Abscess    Joel Elliott is a 26 y.o. male who presents emergency department chief complaint of abscess of the right buttock.  Patient states that he woke from sleep after feeling like he might of gotten bitten on his bottom 5 days ago.  He had progressively worsening swelling over the past several days.  Yesterday he popped the bump and had some drainage from the area.  His swelling and pain did not significantly improve so he came here for further evaluation.  He denies any fevers or chills.   Abscess     Home Medications Prior to Admission medications   Medication Sig Start Date End Date Taking? Authorizing Provider  albuterol (PROVENTIL HFA;VENTOLIN HFA) 108 (90 BASE) MCG/ACT inhaler Inhale 2 puffs into the lungs every 6 (six) hours as needed. For shortness of breath    [provider]  meloxicam (MOBIC) 15 MG tablet Take 1 tablet (15 mg total) by mouth daily. 08/04/21   Cuthriell, Delorise Royals, PA-C      Allergies    Patient has no known allergies.    Review of Systems   Review of Systems  Physical Exam Updated Vital Signs BP 135/69 (BP Location: Right Arm)   Pulse 67   Temp 98.1 F (36.7 C) (Oral)   Resp 18   Ht 5\' 7"  (1.702 m)   Wt 61.2 kg   SpO2 98%   BMI 21.14 kg/m  Physical Exam Vitals and nursing note reviewed.  Constitutional:      General: He is not in acute distress.    Appearance: He is well-developed. He is not diaphoretic.  HENT:     Head: Normocephalic and atraumatic.  Eyes:     General: No scleral icterus.    Conjunctiva/sclera: Conjunctivae normal.  Cardiovascular:     Rate and Rhythm: Normal rate and regular rhythm.     Heart sounds: Normal heart sounds.  Pulmonary:     Effort: Pulmonary effort is normal. No respiratory distress.     Breath sounds: Normal breath sounds.   Abdominal:     Palpations: Abdomen is soft.     Tenderness: There is no abdominal tenderness.  Musculoskeletal:     Cervical back: Normal range of motion and neck supple.  Skin:    General: Skin is warm and dry.     Comments: 3 cm area circular area of induration with 2 central ulcerations.  There is a minimal amount of purulent discharge.  Is draining well, no surrounding induration or erythema, moderately tender to palpation over the 3 cm area of swelling  Neurological:     Mental Status: He is alert.  Psychiatric:        Behavior: Behavior normal.    ED Results / Procedures / Treatments   Labs (all labs ordered are listed, but only abnormal results are displayed) Labs Reviewed - No data to display  EKG None  Radiology No results found.  Procedures Procedures    Medications Ordered in ED Medications - No data to display  ED Course/ Medical Decision Making/ A&P                           Medical Decision Making  26 year old male here for evaluation of abscess.  It is open and draining  well.  He thinks he might of gotten bitten and given the 2 central ulcerations he may very well have had a spider bite to that area.  He does not have any necrosis or signs of surrounding cellulitis.  I will discharge the patient with doxycycline, first dose given tonight.  He does not need an I&D at this time.   Final Clinical Impression(s) / ED Diagnoses Final diagnoses:  Abscess of buttock, right    Rx / DC Orders ED Discharge Orders     None         Arthor Captain, PA-C 05/20/22 2226    Edwin Dada P, DO 05/25/22 2326

## 2024-05-23 ENCOUNTER — Telehealth: Payer: Self-pay

## 2024-05-23 ENCOUNTER — Ambulatory Visit: Admission: EM | Admit: 2024-05-23 | Discharge: 2024-05-23 | Disposition: A | Discharge status: Home / Self Care

## 2024-05-23 DIAGNOSIS — J0100 Acute maxillary sinusitis, unspecified: Secondary | ICD-10-CM

## 2024-05-23 MED ORDER — AMOXICILLIN-POT CLAVULANATE 875-125 MG PO TABS: 1.0000 | ORAL_TABLET | Freq: Two times a day (BID) | ORAL | 0 refills | Status: AC

## 2024-05-23 MED ORDER — FLUTICASONE PROPIONATE 50 MCG/ACT NA SUSP: 2.0000 | Freq: Every day | NASAL | 2 refills | Status: AC

## 2024-05-23 NOTE — Discharge Instructions (Addendum)
 Take medication as directed. Continue Nyquil for your cough. Increase fluids and get plenty of rest. May take over-the-counter Ibuprofen  or Tylenol as needed for pain, fever, or general discomfort. Recommend normal saline nasal spray to help with nasal congestion throughout the day. For your cough, it may be helpful to use a humidifier in your bedroom at nighttime during sleep and to sleep elevated on pillows while symptoms persist. If symptoms fail to improve with this treatment, please follow-up with your primary care physician for further evaluation. Follow-up as needed.

## 2024-05-23 NOTE — Telephone Encounter (Signed)
 Pt called stating he did not receive his prescription at [facility], provider notified and  reviewed chart, sent medications to [facility] on [place].

## 2024-05-23 NOTE — ED Triage Notes (Signed)
 Sinus pain and pressure, congestion, cough, body aches x 1.5 weeks. Taking Nyquil.

## 2024-05-23 NOTE — ED Provider Notes (Signed)
 [facility]    CSN: [id number] Arrival date & time: 05/23/24  0949      History   Chief Complaint Chief Complaint  Patient presents with   Facial Pain   Cough    HPI Joel Elliott is a 28 y.o. male.   The history is provided by the patient.   Patient presents for complaints of headache, nasal congestion, runny nose, cough, sinus pressure, and bodyaches.  Symptoms have been present for the past 1-1/2 weeks.  Patient denies fever, chills, ear drainage, wheezing, difficulty breathing, chest pain, abdominal pain, nausea, vomiting, diarrhea, or rash.  Patient denies any obvious known sick contacts.  States he has been taking NyQuil for his symptoms.  Denies history of asthma, states that he does smoke marijuana.  History reviewed. No pertinent past medical history.  There are no active problems to display for this patient.   History reviewed. No pertinent surgical history.     Home Medications    Prior to Admission medications   Medication Sig Start Date End Date Taking? Authorizing Provider  albuterol (PROVENTIL HFA;VENTOLIN HFA) 108 (90 BASE) MCG/ACT inhaler Inhale 2 puffs into the lungs every 6 (six) hours as needed. For shortness of breath    [provider]  doxycycline  (VIBRAMYCIN ) 100 MG capsule Take 1 capsule (100 mg total) by mouth 2 (two) times daily. One po bid x 7 days 05/20/22   Harris, Abigail, PA-C  meloxicam  (MOBIC ) 15 MG tablet Take 1 tablet (15 mg total) by mouth daily. 08/04/21   Cuthriell, Ardath Bears, PA-C    Family History History reviewed. No pertinent family history.  Social History Social History   Tobacco Use   Smoking status: Never   Smokeless tobacco: Never  Vaping Use   Vaping status: Never Used  Substance Use Topics   Alcohol use: No   Drug use: Yes    Types: Marijuana    Comment: last use this morning     Allergies   Patient has no known allergies.   Review of Systems Review of Systems Per HPI  Physical  Exam Triage Vital Signs ED Triage Vitals [05/23/24 1003]  Encounter Vitals Group     BP 125/79     Systolic BP Percentile      Diastolic BP Percentile      Pulse Rate 77     Resp 18     Temp 97.9 F (36.6 C)     Temp Source Oral     SpO2 98 %     Weight      Height      Head Circumference      Peak Flow      Pain Score 0     Pain Loc      Pain Education      Exclude from Growth Chart    No data found.  Updated Vital Signs BP 125/79 (BP Location: Right Arm)   Pulse 77   Temp 97.9 F (36.6 C) (Oral)   Resp 18   SpO2 98%   Visual Acuity Right Eye Distance:   Left Eye Distance:   Bilateral Distance:    Right Eye Near:   Left Eye Near:    Bilateral Near:     Physical Exam Vitals and nursing note reviewed.  Constitutional:      General: He is not in acute distress.    Appearance: Normal appearance.  HENT:     Head: Normocephalic.     Right Ear: Tympanic  membrane, ear canal and external ear normal.     Left Ear: Tympanic membrane, ear canal and external ear normal.     Nose: Congestion present.     Right Turbinates: Enlarged and swollen.     Left Turbinates: Enlarged and swollen.     Right Sinus: Maxillary sinus tenderness present. No frontal sinus tenderness.     Left Sinus: Maxillary sinus tenderness present. No frontal sinus tenderness.     Mouth/Throat:     Lips: Pink.     Mouth: Mucous membranes are moist.     Pharynx: Postnasal drip present. No pharyngeal swelling, oropharyngeal exudate, posterior oropharyngeal erythema or uvula swelling.     Tonsils: Tonsillar exudate present.     Comments: Cobblestoning present to posterior oropharynx  Eyes:     Extraocular Movements: Extraocular movements intact.     Conjunctiva/sclera: Conjunctivae normal.     Pupils: Pupils are equal, round, and reactive to light.  Cardiovascular:     Rate and Rhythm: Normal rate and regular rhythm.     Pulses: Normal pulses.     Heart sounds: Normal heart sounds.  Pulmonary:      Effort: Pulmonary effort is normal. No respiratory distress.     Breath sounds: Normal breath sounds. No stridor. No wheezing, rhonchi or rales.  Abdominal:     General: Bowel sounds are normal.     Palpations: Abdomen is soft.     Tenderness: There is no abdominal tenderness.  Musculoskeletal:     Cervical back: Normal range of motion.  Lymphadenopathy:     Cervical: No cervical adenopathy.  Skin:    General: Skin is warm and dry.  Neurological:     General: No focal deficit present.     Mental Status: He is alert and oriented to person, place, and time.  Psychiatric:        Mood and Affect: Mood normal.        Behavior: Behavior normal.      UC Treatments / Results  Labs (all labs ordered are listed, but only abnormal results are displayed) Labs Reviewed - No data to display  EKG   Radiology No results found.  Procedures Procedures (including critical care time)  Medications Ordered in UC Medications - No data to display  Initial Impression / Assessment and Plan / UC Course  I have reviewed the triage vital signs and the nursing notes.  Pertinent labs & imaging results that were available during my care of the patient were reviewed by me and considered in my medical decision making (see chart for details).  On exam, lung sounds are clear throughout, room air sats at 98%.  Patient does have moderate maxillary sinus tenderness.  Symptoms have been present for greater than 10 days.  Symptoms are consistent with acute maxillary sinusitis.  Will treat with Augmentin 875/125 mg twice daily for the next 7 days.  Fluticasone 50 mcg nasal spray also prescribed for nasal congestion and runny nose.  Supportive care recommendations were provided and discussed with the patient to include fluids, rest, continuing over-the-counter NyQuil, normal saline nasal spray, use of a humidifier during sleep.  Discussed indications with patient regarding follow-up.  Patient was in agreement  with this plan of care and verbalizes understanding.  All questions were answered.  Patient stable for discharge.  Final Clinical Impressions(s) / UC Diagnoses   Final diagnoses:  Acute maxillary sinusitis, recurrence not specified     Discharge Instructions      Take medication  as directed. Continue Nyquil for your cough. Increase fluids and get plenty of rest. May take over-the-counter Ibuprofen  or Tylenol as needed for pain, fever, or general discomfort. Recommend normal saline nasal spray to help with nasal congestion throughout the day. For your cough, it may be helpful to use a humidifier in your bedroom at nighttime during sleep and to sleep elevated on pillows while symptoms persist. If symptoms fail to improve with this treatment, please follow-up with your primary care physician for further evaluation. Follow-up as needed.   ED Prescriptions   None    PDMP not reviewed this encounter.   Hardy Lia, NP 05/23/24 1021
